# Patient Record
Sex: Female | Born: 1985 | Hispanic: Yes | Marital: Single | Smoking: Never smoker
Health system: Southern US, Community
[De-identification: ages and names within clinical notes are randomized; demographics above are authoritative.]

## PROBLEM LIST (undated history)

## (undated) DIAGNOSIS — I10 Essential (primary) hypertension: Secondary | ICD-10-CM

## (undated) HISTORY — DX: Essential (primary) hypertension: I10

---

## 2006-03-10 ENCOUNTER — Emergency Department: Payer: Self-pay | Admitting: Emergency Medicine

## 2011-02-21 ENCOUNTER — Emergency Department: Payer: Self-pay | Admitting: Emergency Medicine

## 2011-02-24 ENCOUNTER — Emergency Department: Payer: Self-pay | Admitting: Emergency Medicine

## 2014-06-01 ENCOUNTER — Ambulatory Visit: Payer: Self-pay

## 2014-07-01 ENCOUNTER — Ambulatory Visit: Payer: Self-pay | Admitting: Physician Assistant

## 2017-01-14 ENCOUNTER — Other Ambulatory Visit: Payer: Self-pay | Admitting: Family Medicine

## 2017-01-14 DIAGNOSIS — Z3201 Encounter for pregnancy test, result positive: Secondary | ICD-10-CM

## 2017-01-21 ENCOUNTER — Ambulatory Visit: Payer: Self-pay

## 2017-01-28 ENCOUNTER — Ambulatory Visit
Admission: RE | Admit: 2017-01-28 | Discharge: 2017-01-28 | Disposition: A | Discharge status: Home / Self Care | Payer: 59 | Source: Ambulatory Visit | Attending: Family Medicine | Admitting: Family Medicine

## 2017-01-28 DIAGNOSIS — Z3201 Encounter for pregnancy test, result positive: Secondary | ICD-10-CM

## 2017-01-28 DIAGNOSIS — Z3A08 8 weeks gestation of pregnancy: Secondary | ICD-10-CM | POA: Diagnosis not present

## 2017-01-28 DIAGNOSIS — Z3481 Encounter for supervision of other normal pregnancy, first trimester: Secondary | ICD-10-CM | POA: Diagnosis not present

## 2017-03-26 ENCOUNTER — Other Ambulatory Visit: Payer: Self-pay | Admitting: Family Medicine

## 2017-03-26 DIAGNOSIS — Z3480 Encounter for supervision of other normal pregnancy, unspecified trimester: Secondary | ICD-10-CM

## 2017-03-31 ENCOUNTER — Ambulatory Visit
Admission: RE | Admit: 2017-03-31 | Discharge: 2017-03-31 | Disposition: A | Discharge status: Home / Self Care | Payer: 59 | Source: Ambulatory Visit | Attending: Family Medicine | Admitting: Family Medicine

## 2017-03-31 DIAGNOSIS — O99810 Abnormal glucose complicating pregnancy: Secondary | ICD-10-CM | POA: Diagnosis present

## 2017-03-31 DIAGNOSIS — Z3A17 17 weeks gestation of pregnancy: Secondary | ICD-10-CM | POA: Diagnosis not present

## 2017-03-31 DIAGNOSIS — Z3480 Encounter for supervision of other normal pregnancy, unspecified trimester: Secondary | ICD-10-CM

## 2017-04-22 ENCOUNTER — Other Ambulatory Visit: Payer: Self-pay | Admitting: Family Medicine

## 2017-04-22 DIAGNOSIS — Z3482 Encounter for supervision of other normal pregnancy, second trimester: Secondary | ICD-10-CM

## 2017-05-11 ENCOUNTER — Encounter (HOSPITAL_COMMUNITY): Payer: Self-pay

## 2017-05-11 ENCOUNTER — Ambulatory Visit
Admission: RE | Admit: 2017-05-11 | Discharge: 2017-05-11 | Disposition: A | Discharge status: Home / Self Care | Payer: 59 | Source: Ambulatory Visit | Attending: Family Medicine | Admitting: Family Medicine

## 2017-05-11 DIAGNOSIS — Z3482 Encounter for supervision of other normal pregnancy, second trimester: Secondary | ICD-10-CM

## 2017-05-11 DIAGNOSIS — Z362 Encounter for other antenatal screening follow-up: Secondary | ICD-10-CM | POA: Diagnosis present

## 2017-05-11 DIAGNOSIS — Z3A23 23 weeks gestation of pregnancy: Secondary | ICD-10-CM | POA: Diagnosis not present

## 2017-07-08 ENCOUNTER — Other Ambulatory Visit: Payer: Self-pay | Admitting: Family Medicine

## 2017-07-08 DIAGNOSIS — Z3483 Encounter for supervision of other normal pregnancy, third trimester: Secondary | ICD-10-CM

## 2017-07-16 ENCOUNTER — Ambulatory Visit
Admission: RE | Admit: 2017-07-16 | Discharge: 2017-07-16 | Disposition: A | Discharge status: Home / Self Care | Payer: 59 | Source: Ambulatory Visit | Attending: Family Medicine | Admitting: Family Medicine

## 2017-07-16 DIAGNOSIS — Z3483 Encounter for supervision of other normal pregnancy, third trimester: Secondary | ICD-10-CM

## 2018-11-02 IMAGING — US US OB COMP LESS 14 WK
1 series · 14 of 28 positions shown · non-contrast
Comparison: None.

CLINICAL DATA: Positive pregnancy test.

EXAM:
OBSTETRIC <14 WK US AND TRANSVAGINAL OB US
TECHNIQUE: Both transabdominal and transvaginal ultrasound examinations were
performed for complete evaluation of the gestation as well as the
maternal uterus, adnexal regions, and pelvic cul-de-sac.
Transvaginal technique was performed to assess early pregnancy.

[Series 1: us ob comp less 14 wk · 0.20mm/px · 14 of 329 slices shown]
[im 13/329]
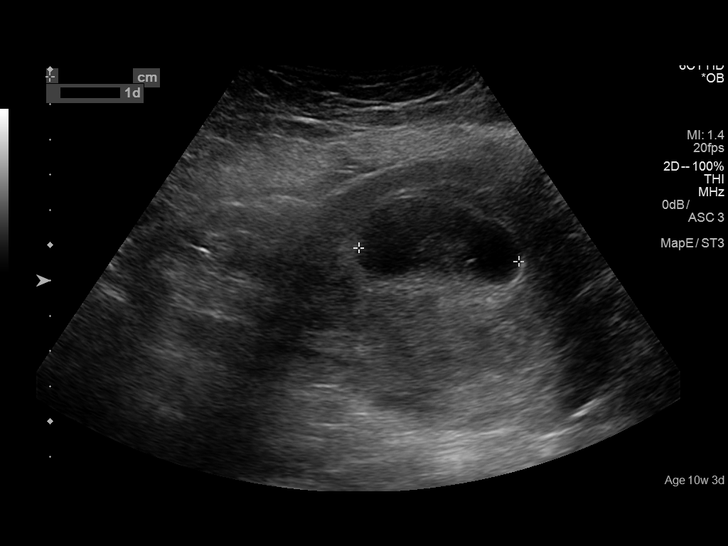
[im 37/329]
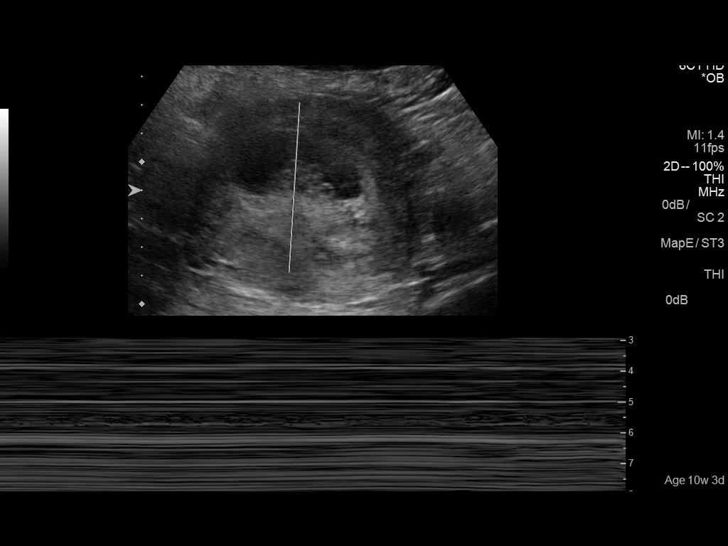
[im 61/329]
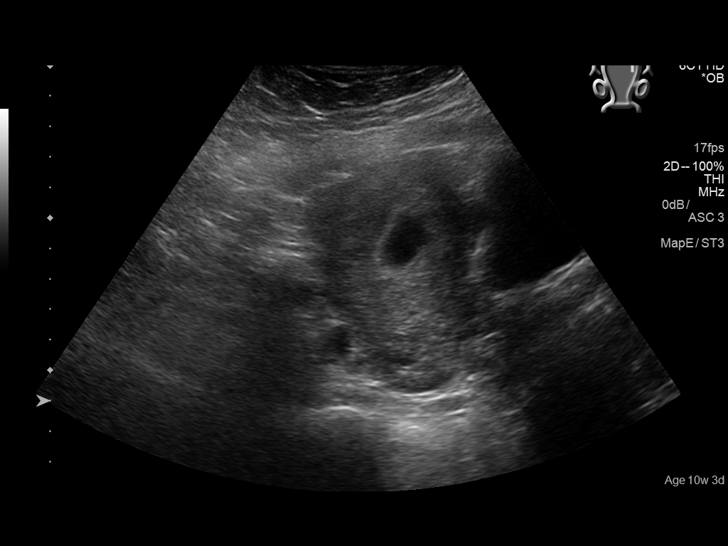
[im 86/329]
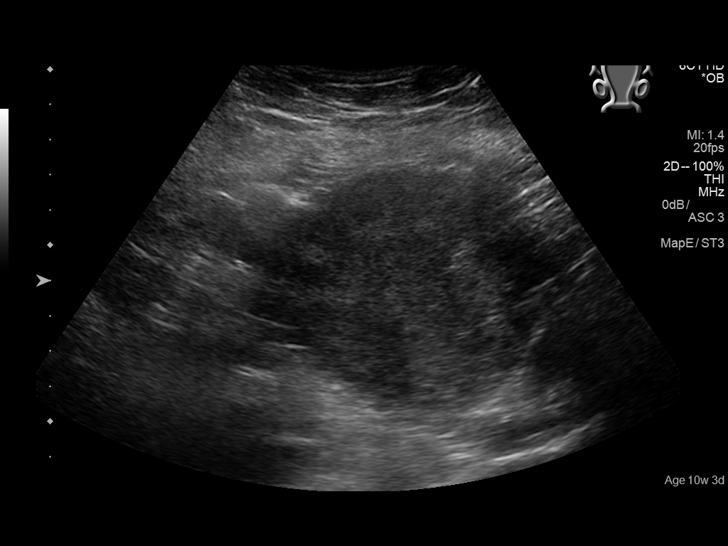
[im 110/329]
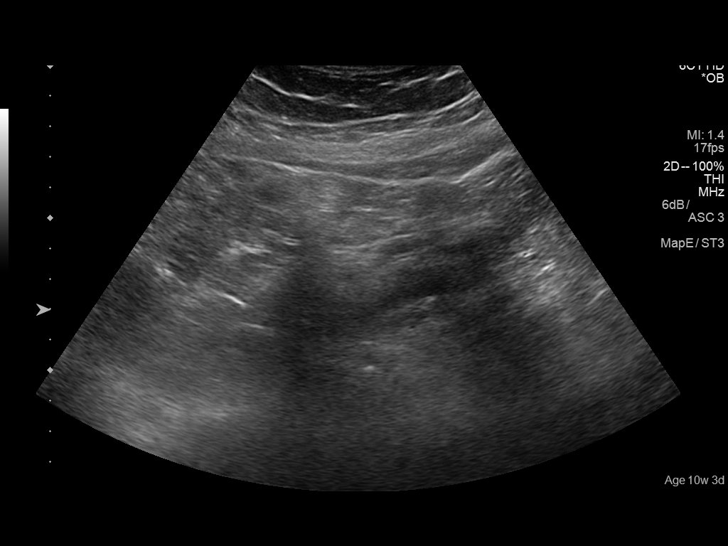
[im 134/329]
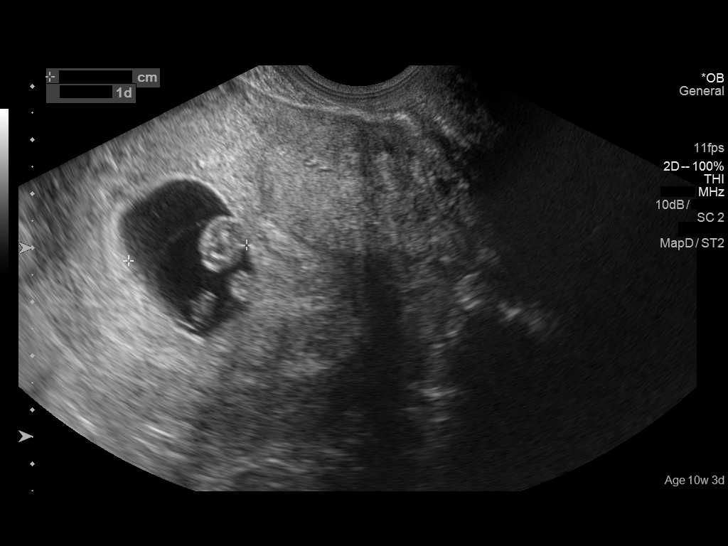
[im 158/329]
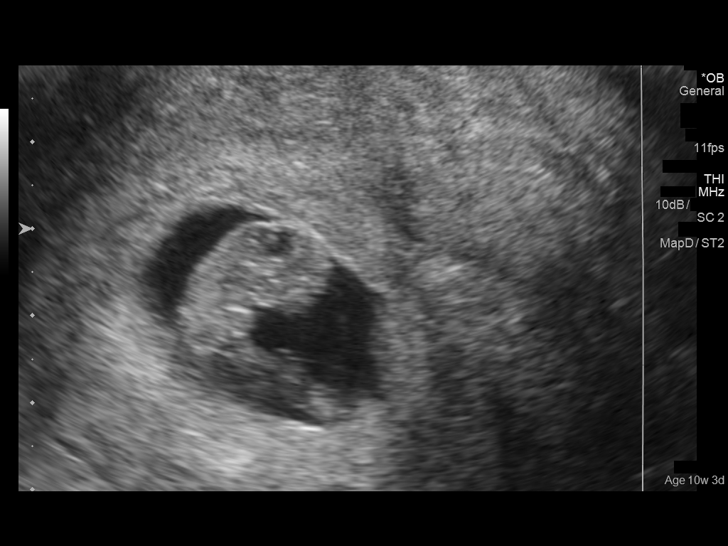
[im 183/329]
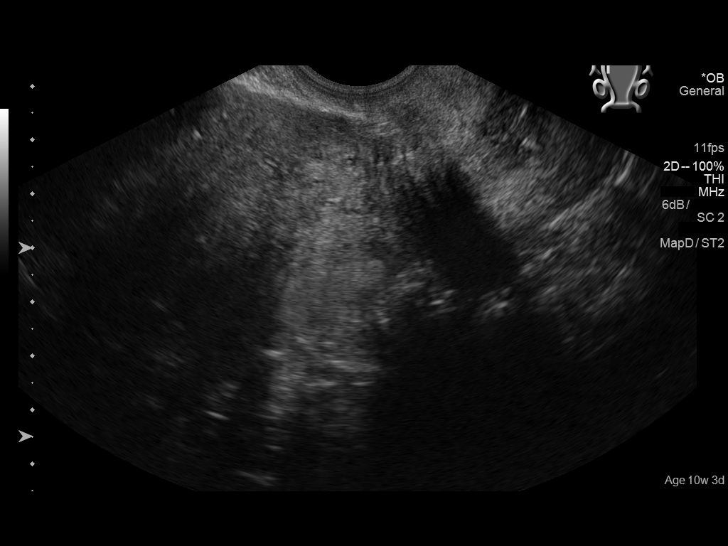
[im 207/329]
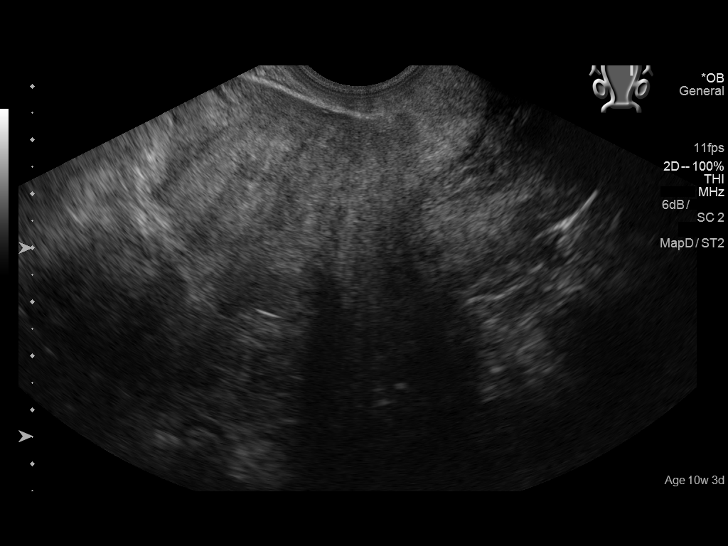
[im 231/329]
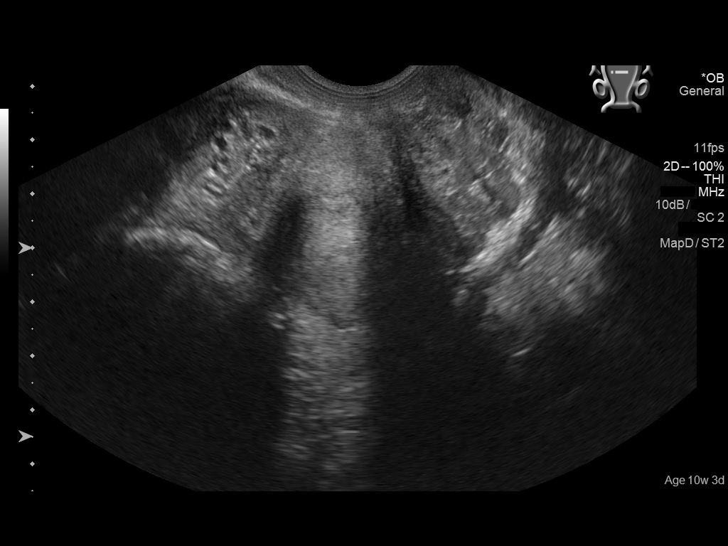
[im 256/329]
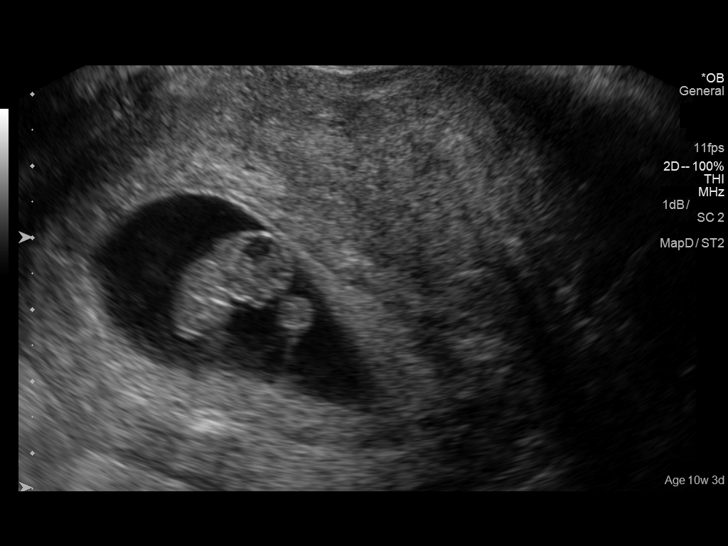
[im 280/329]
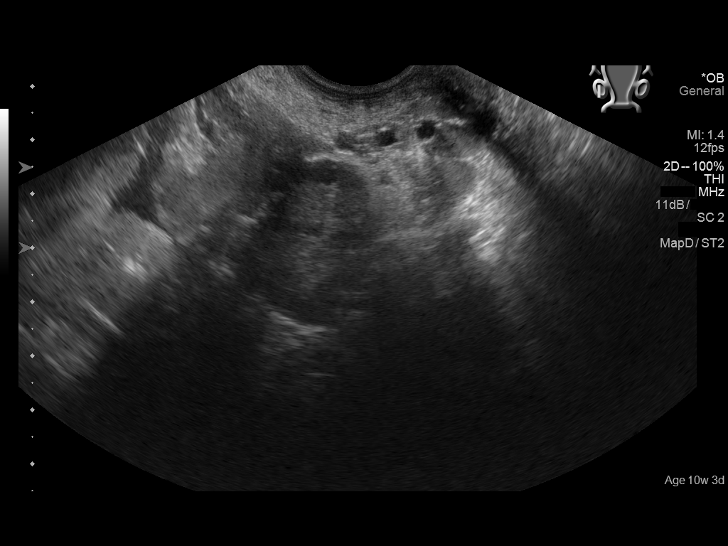
[im 304/329]
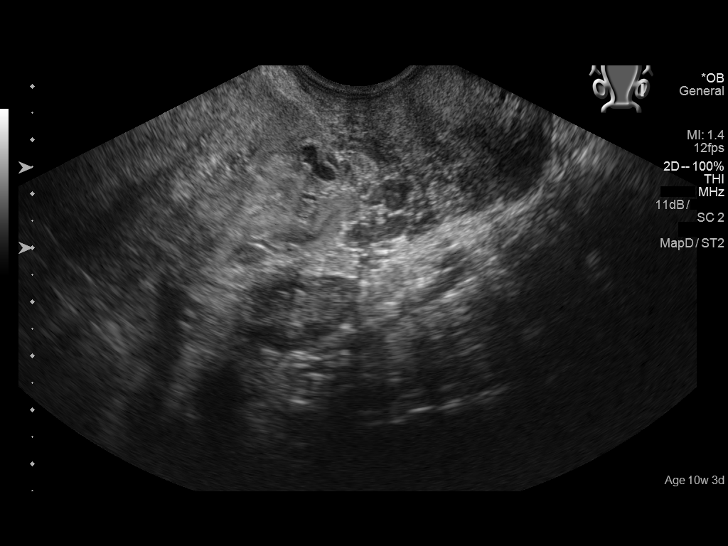
[im 329/329]
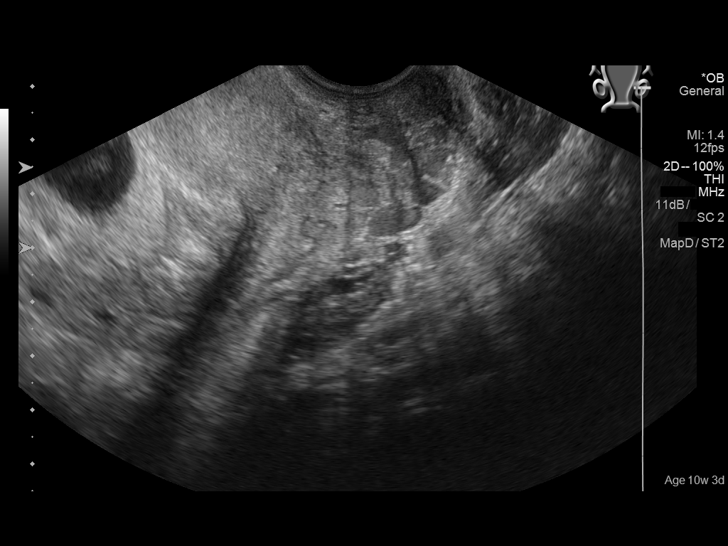

[14 of 28 positions shown; findings below may reference images not displayed]

FINDINGS: Intrauterine gestational sac: Single.

Yolk sac:  Visualized.

Embryo:  Visualized.

Cardiac Activity: Visualized.

Heart Rate: 165  bpm

CRL:  21  mm   8 w   5 d                  US EDC: September 04, 2017.

Subchorionic hemorrhage:  None visualized.

Maternal uterus/adnexae: Ovaries appear normal. No free fluid is
noted.
IMPRESSION: Single live intrauterine gestation of 8 weeks 5 days.

## 2019-01-03 IMAGING — US US OB COMP +14 WK
1 series · 13 of 28 positions shown · non-contrast
Comparison: none

CLINICAL DATA: Abnormal maternal glucose tolerance. Multi gravida.
Current assigned gestational age of 17 weeks 0 days.

EXAM:
OBSTETRICAL ULTRASOUND >14 WKS

[Series 1: us ob comp +14 wk · 0.26mm/px · 13 of 95 slices shown]
[im 4/95]
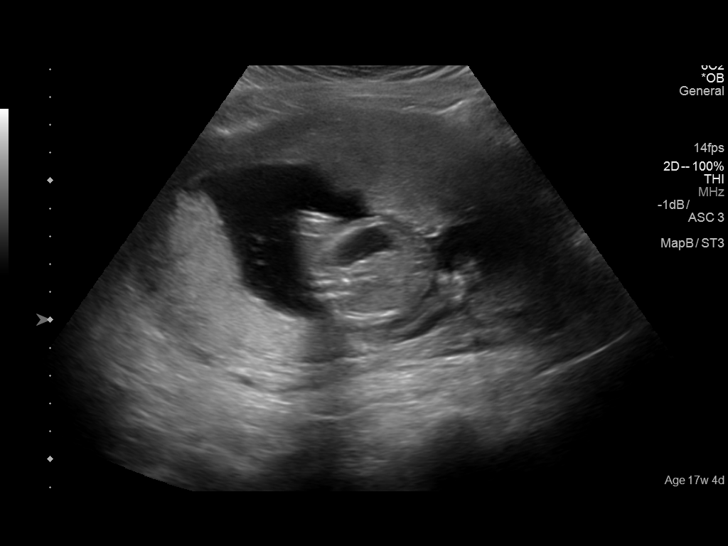
[im 11/95]
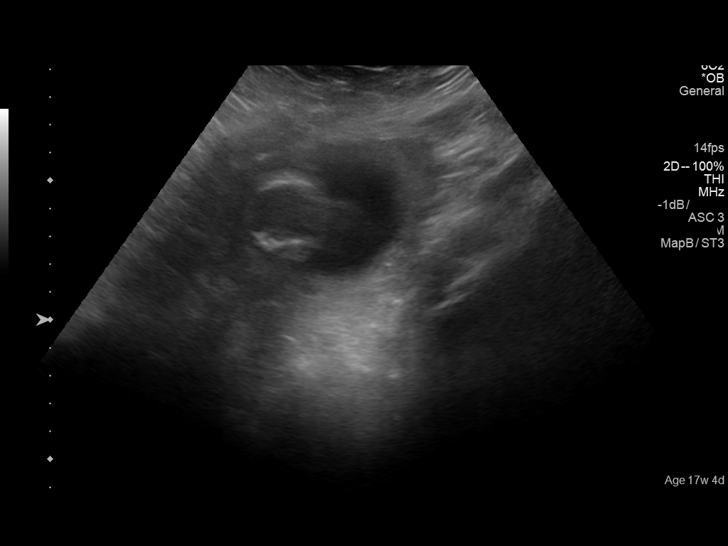
[im 18/95]
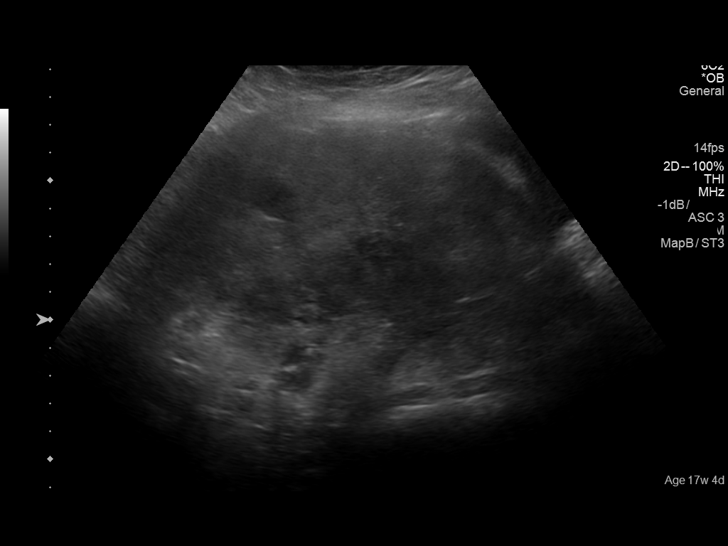
[im 25/95]
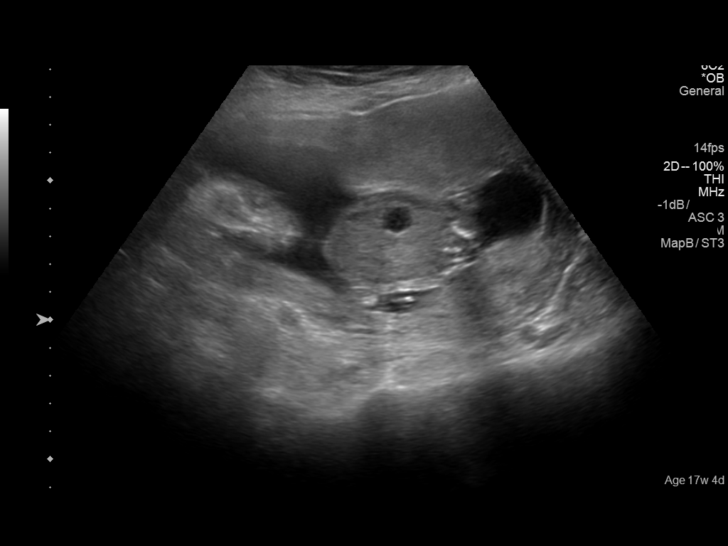
[im 32/95]
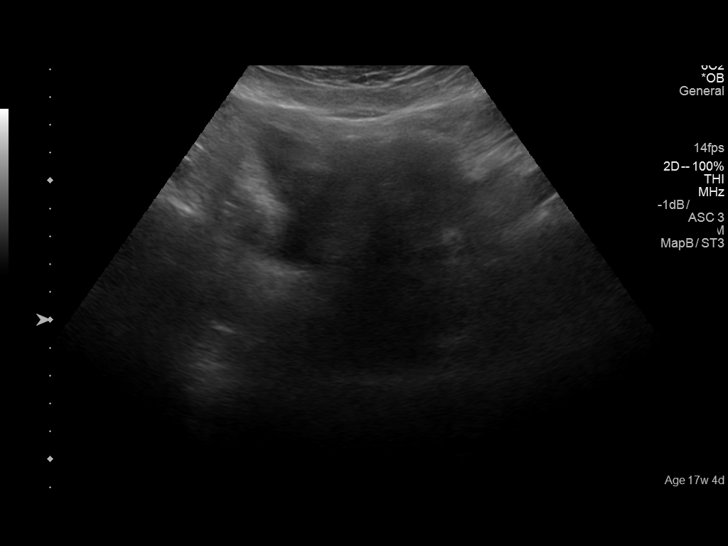
[im 39/95]
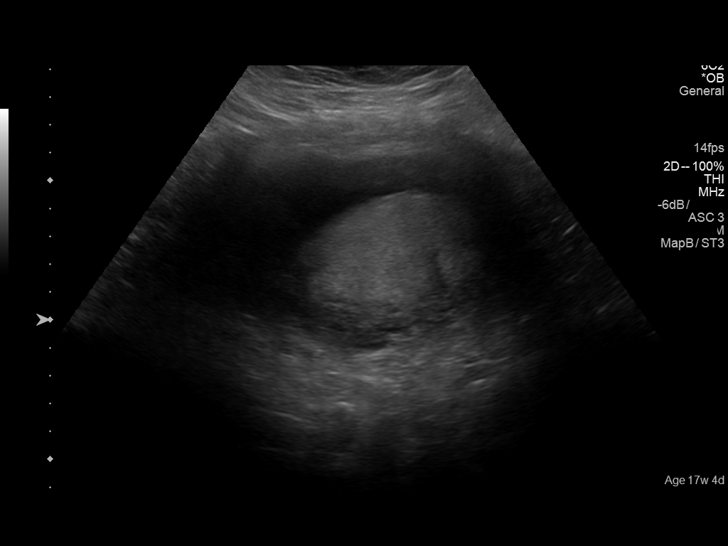
[im 49/95]
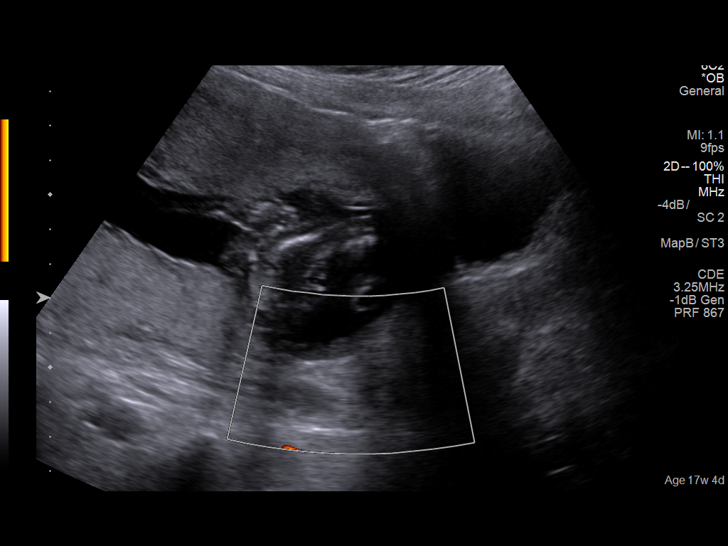
[im 56/95]
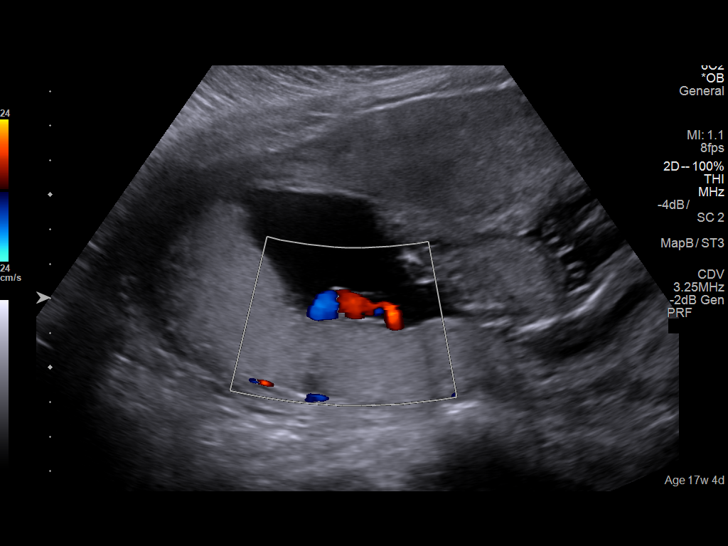
[im 63/95]
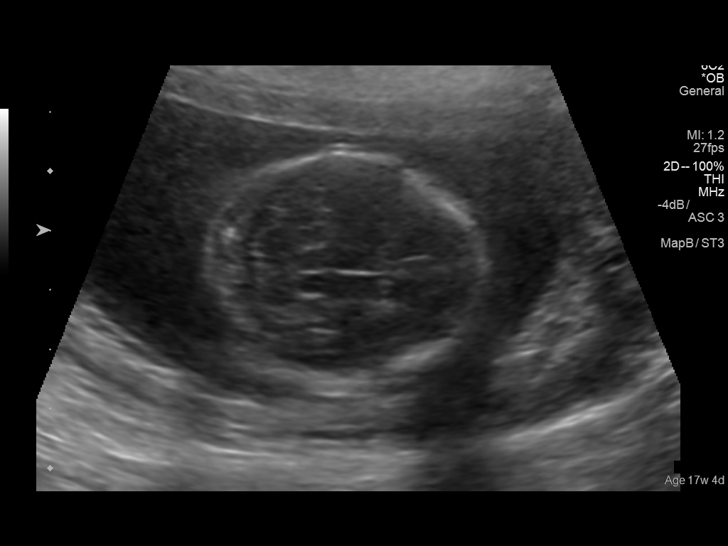
[im 70/95]
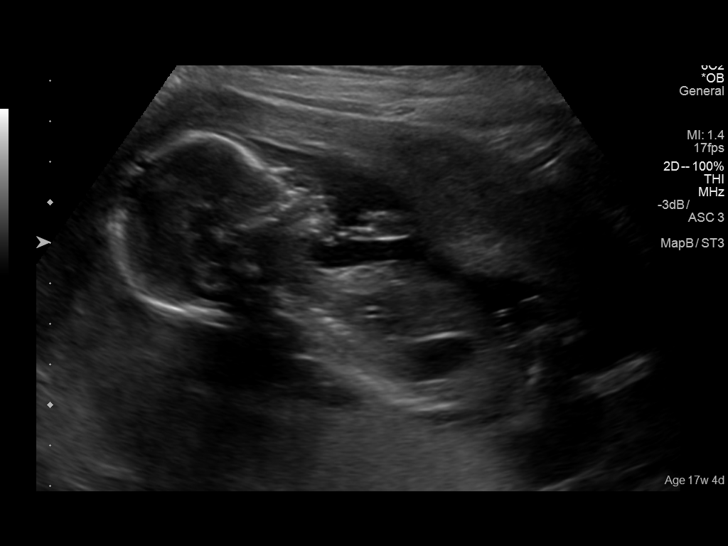
[im 77/95]
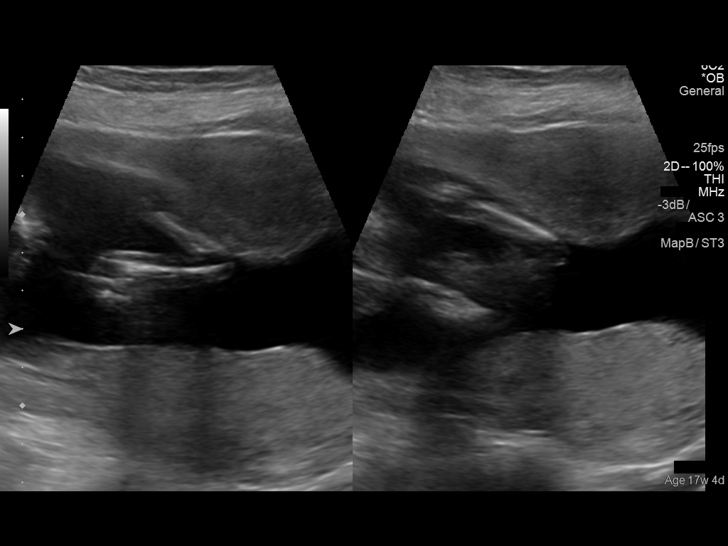
[im 84/95]
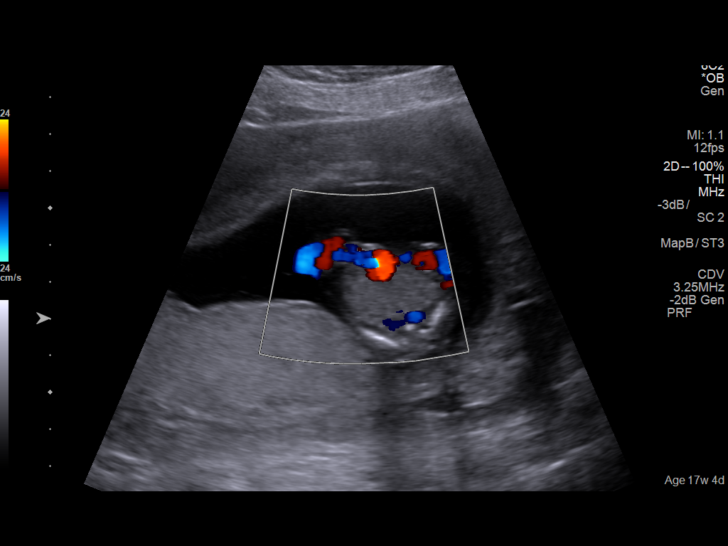
[im 91/95]
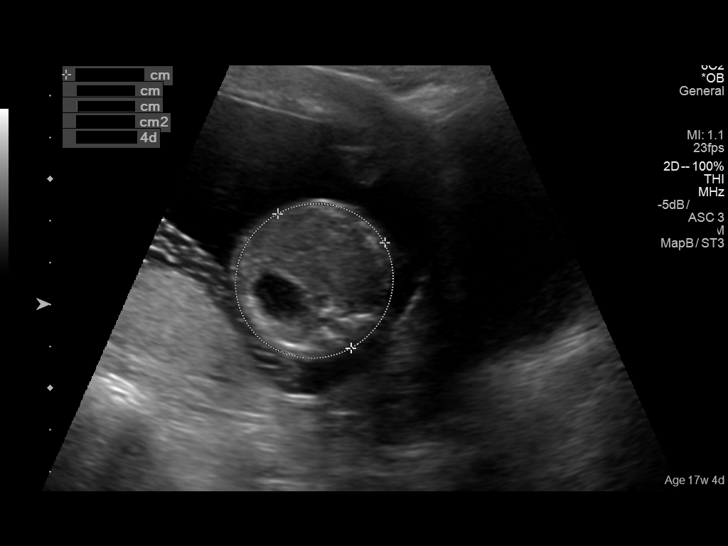

[13 of 28 positions shown; findings below may reference images not displayed]

FINDINGS: Number of Fetuses: 1

Heart Rate:  153 bpm

Movement: Yes

Presentation: Breech

Previa: No

Placental Location: Posterior

Amniotic Fluid (Subjective): Within normal limits

Amniotic Fluid (Objective):

Vertical pocket 3.1cm

FETAL BIOMETRY

BPD:  3.7cm 17w 2d

HC:    13.6cm  17w   0d

AC:   12.0cm  17w   5d

FL:   2.4cm  17w   2d

Current Mean GA: 17w 2d              US EDC: 09/06/2017

FETAL ANATOMY

Lateral Ventricles: Limited views appear normal

Thalami/CSP: Appears normal

Posterior Fossa:  Appears normal

Nuchal Region: Appears normal    NFT= 2.6mm

Upper Lip: Not visualized

Spine: Not visualized

4 Chamber Heart on Left: Not visualized

LVOT: Appears normal

RVOT: Appears normal

Stomach on Left: Appears normal

3 Vessel Cord: Appears normal

Cord Insertion site: Appears normal

Kidneys: Not well visualized

Bladder: Appears normal

Extremities: Appears normal

Sex: Male

Technically difficult due to: Fetal position and early gestational
age.

Maternal Findings:

Cervix:  4.3 cm TA
IMPRESSION: Assigned gestational age is currently 17 weeks 0 days. Appropriate
fetal growth.

Suboptimal evaluation of fetal anatomy, however no fetal
abnormalities involving visualized anatomy listed above.

## 2019-02-13 IMAGING — US US OB FOLLOW-UP
1 series · 13 of 28 positions shown · non-contrast
Comparison: none

CLINICAL DATA: 30-year-old gravida 3 para 2. Follow-up for anatomy
and growth. By LMP patient is 23 weeks 1 day and has EDC of
09/06/2017.

EXAM:
OBSTETRIC 14+ WK ULTRASOUND FOLLOW-UP

[Series 1: us ob follow-up · 0.26mm/px · 13 of 76 slices shown]
[im 3/76]
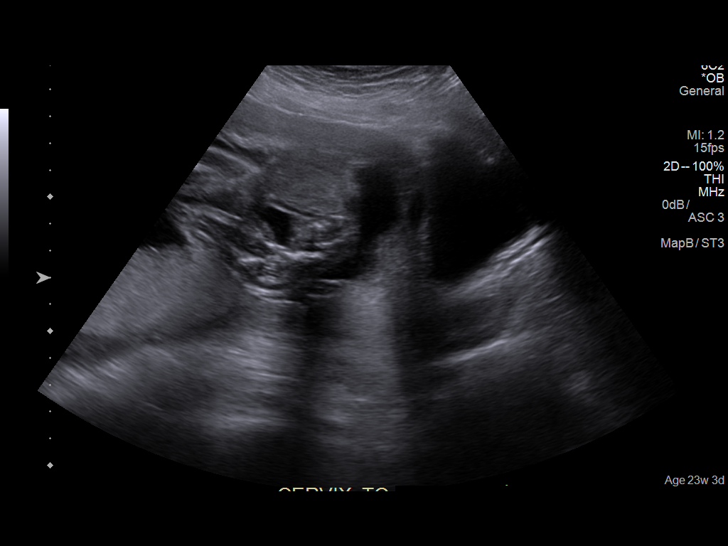
[im 9/76]
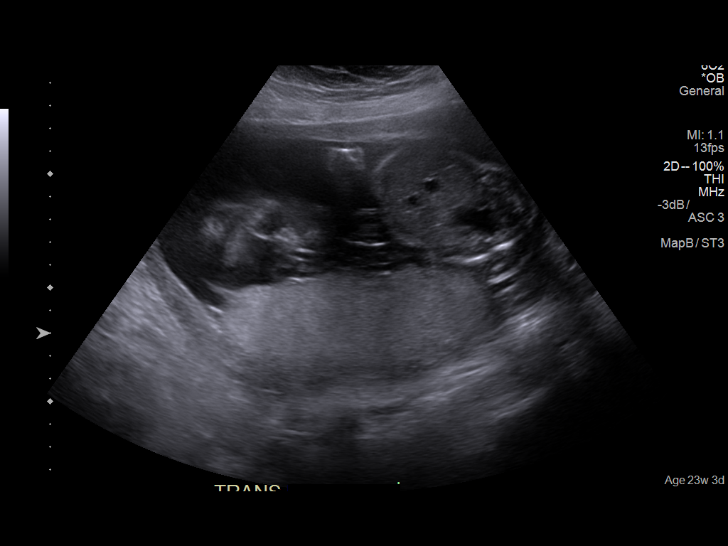
[im 14/76]
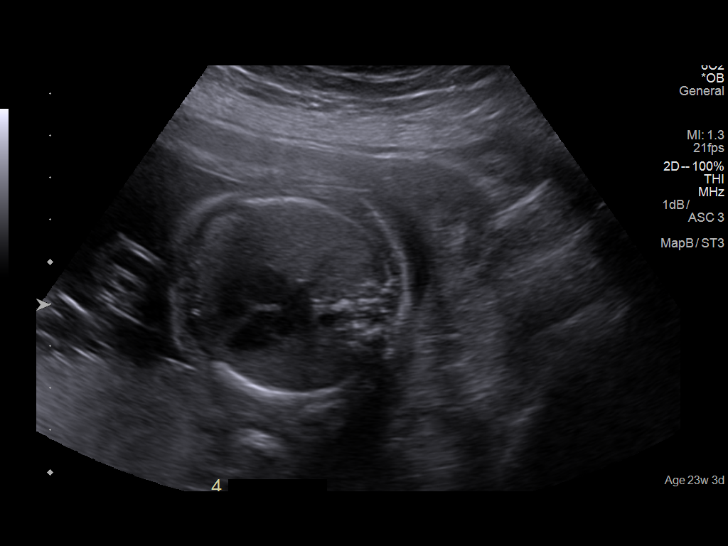
[im 20/76]
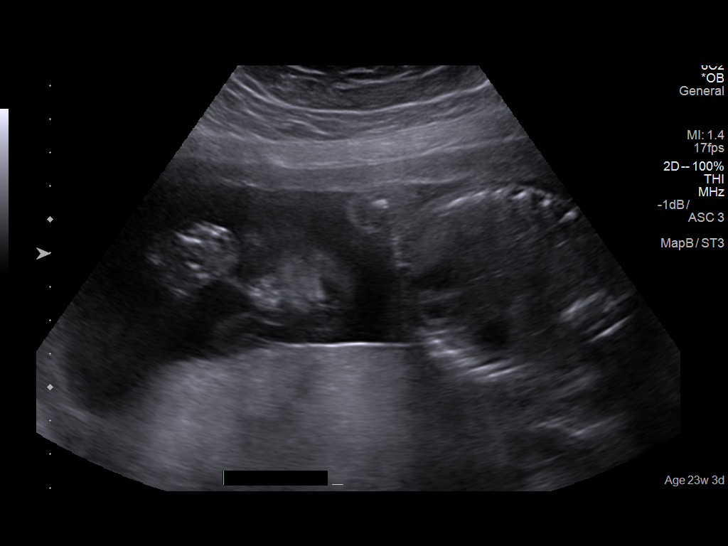
[im 26/76]
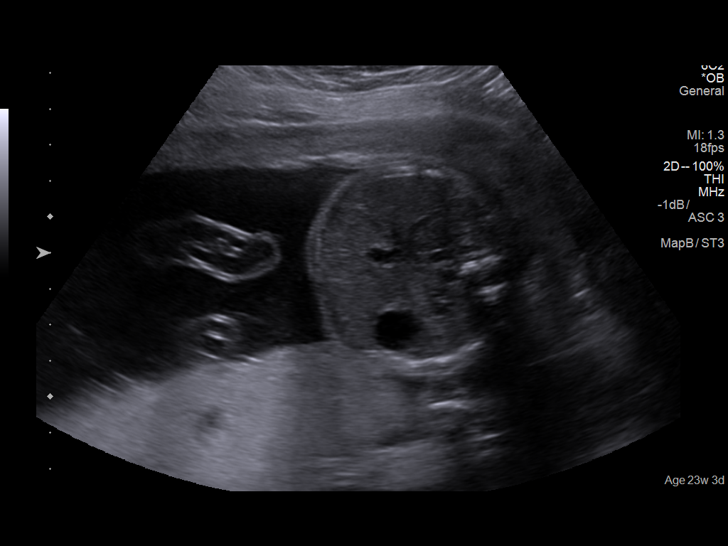
[im 31/76]
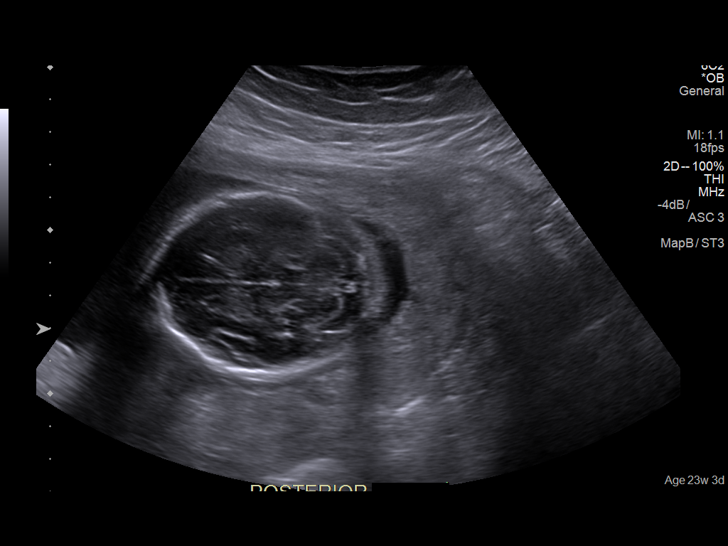
[im 39/76]
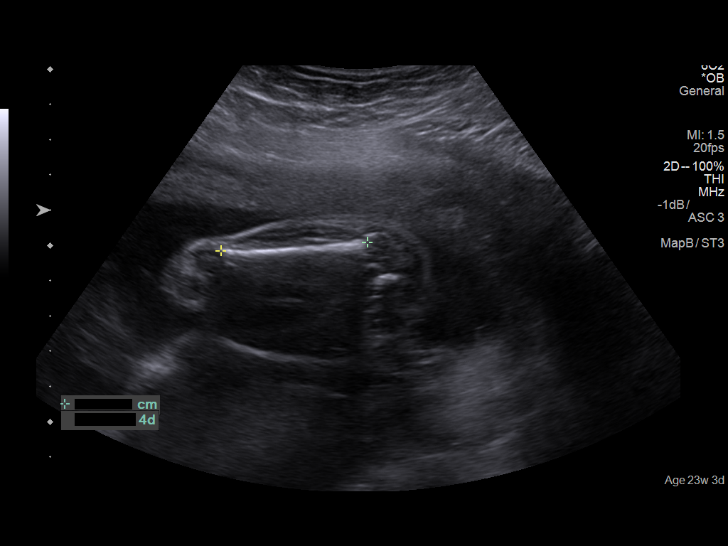
[im 45/76]
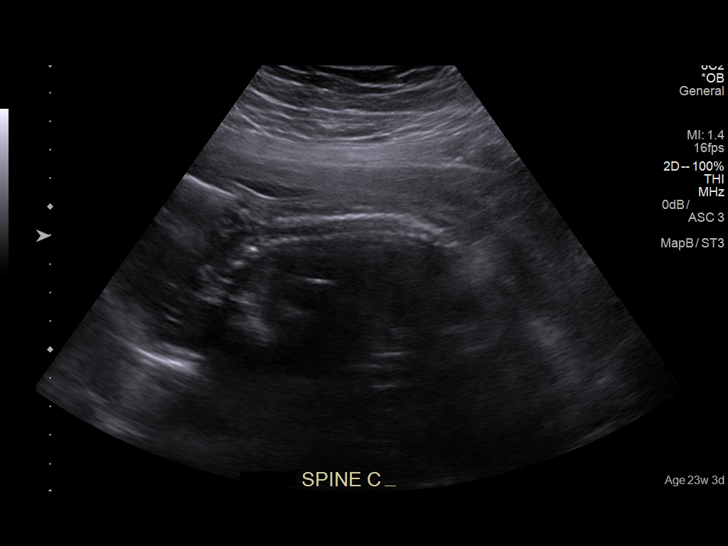
[im 51/76]
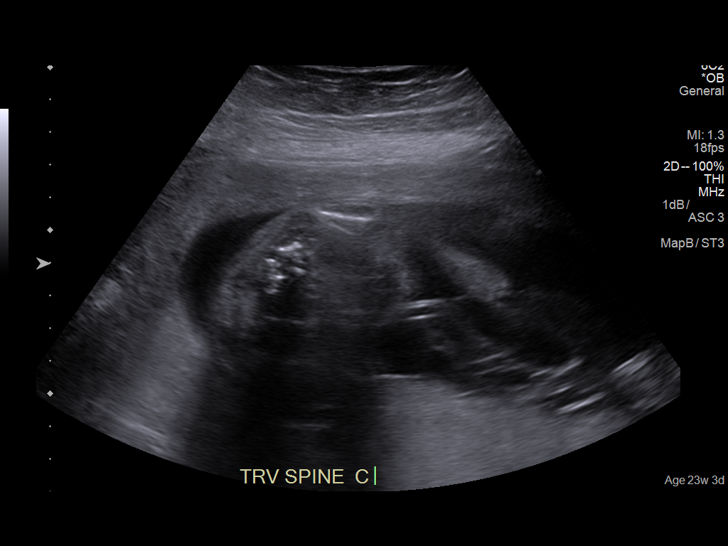
[im 56/76]
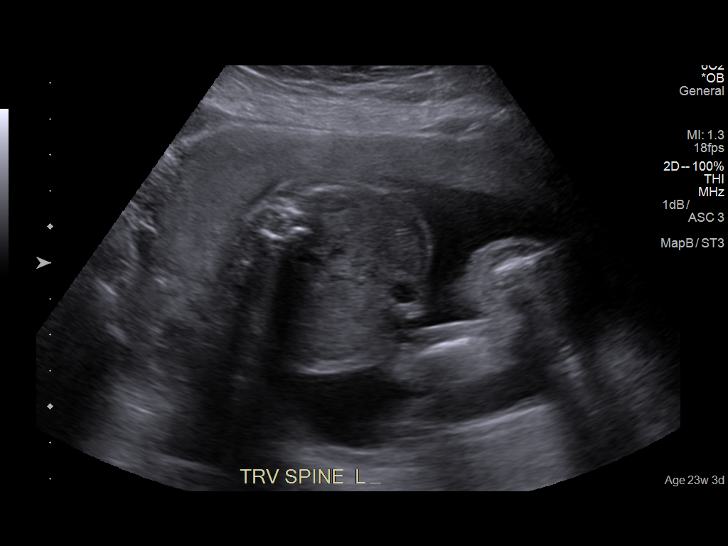
[im 62/76]
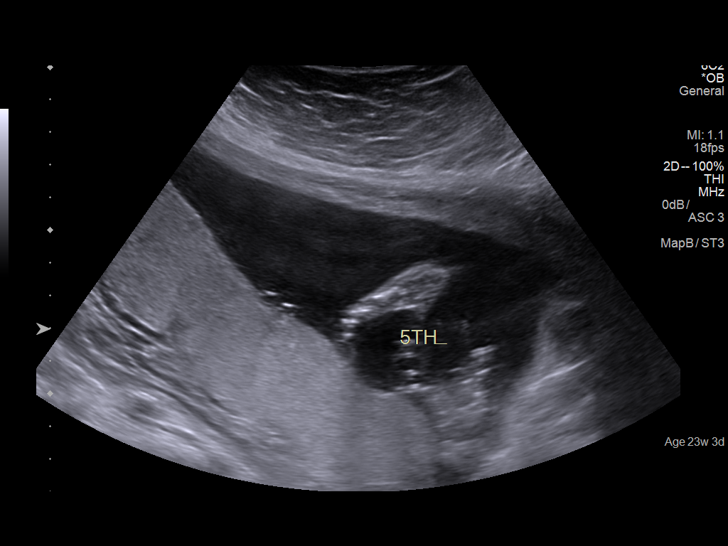
[im 67/76]
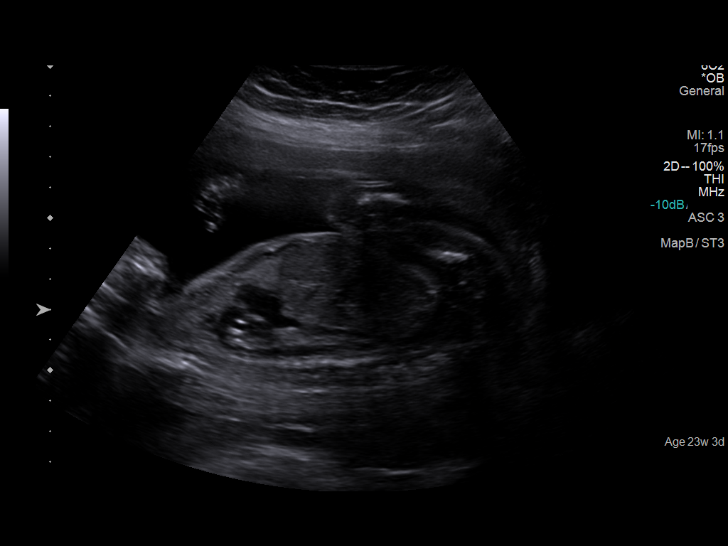
[im 73/76]
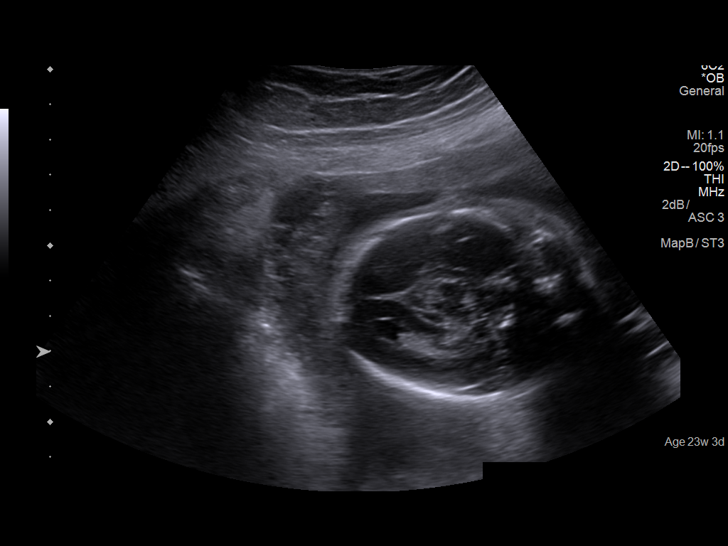

[13 of 28 positions shown; findings below may reference images not displayed]

FINDINGS: Number of Fetuses: 1

Heart Rate:  155 bpm

Movement: Present

Presentation: Variable

Previa: None

Placental Location: Posterior

Amniotic Fluid (Subjective): Normal

Amniotic Fluid (Objective):

Vertical pocket 4.4cm

FETAL BIOMETRY

BPD:  5.56cm 23w   0d

HC:    20.38cm  22w   4d

AC:   18.63cm  23w   3d

FL:   4.19cm  23w   4d

Current Mean GA: 23w 2d          US EDC: 09/05/2017

Assigned GA:  23w 1d              Assigned EDC:  09/06/2017

FETAL ANATOMY

Lateral Ventricles: Previously visualized

Thalami/CSP: Previously visualized

Posterior Fossa:  Previously visualized

Nuchal Region: Previously visualized    NFT= not applicablemm

Upper Lip: Visualized

Spine: Visualized

4 Chamber Heart on Left: Visualized

LVOT: Previously visualized

RVOT: Previously visualized

Stomach on Left: Visualized

3 Vessel Cord: Visualized

Cord Insertion site: Visualized

Kidneys: Visualized

Bladder: Visualized

Extremities: Previously visualized

Technically difficult due to: Not applicable

MATERNAL FINDINGS:

Cervix: 3.5 cm on transabdominal evaluation.
IMPRESSION: 1. Single living intrauterine fetus in variable presentation.
2. Ultrasound size correlates well with LMP dating.
3. No fetal anomalies are identified.

## 2019-04-20 IMAGING — US US OB FOLLOW-UP
1 series · 13 of 28 positions shown · non-contrast
Comparison: none

CLINICAL DATA: Follow-up for appropriate growth, SS amniotic fluid
volume.

EXAM:
OBSTETRICAL ULTRASOUND >14 WKS

[Series 1: us ob follow-up · 0.26mm/px · 13 of 41 slices shown]
[im 2/41]
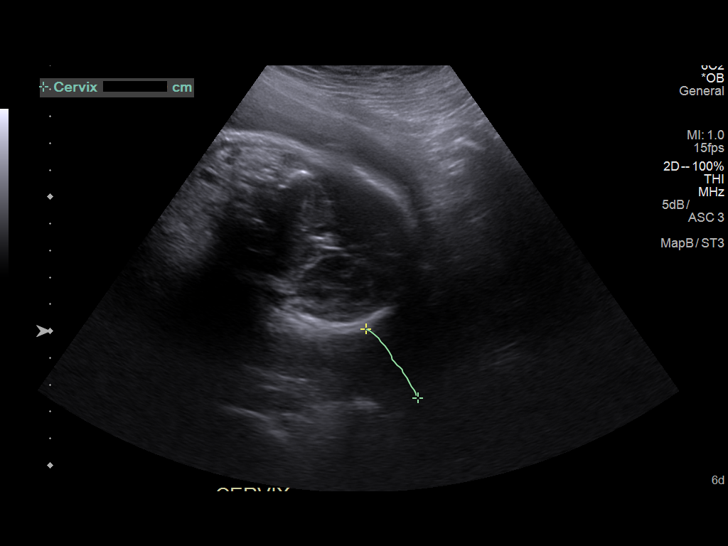
[im 5/41]
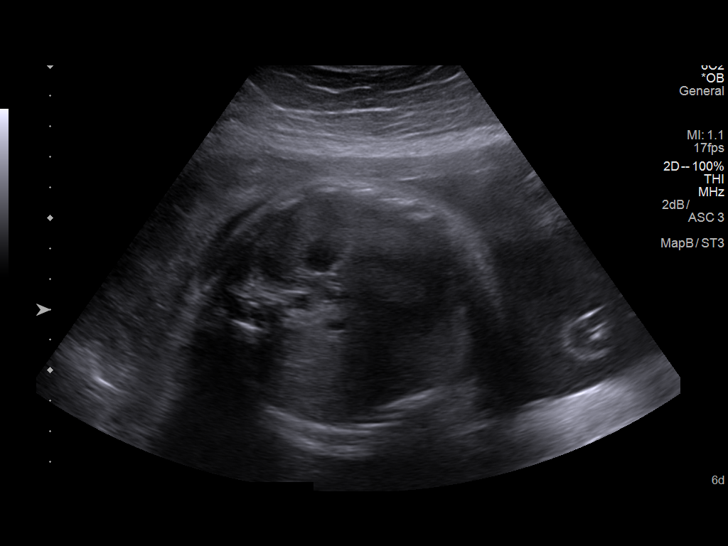
[im 8/41]
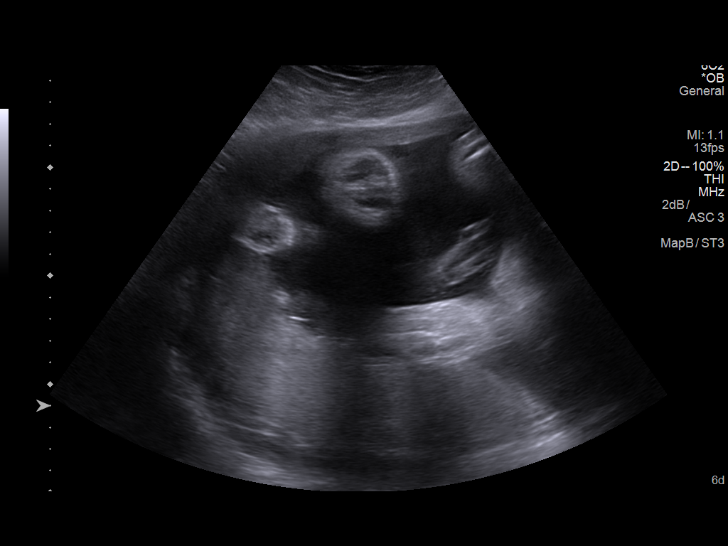
[im 11/41]
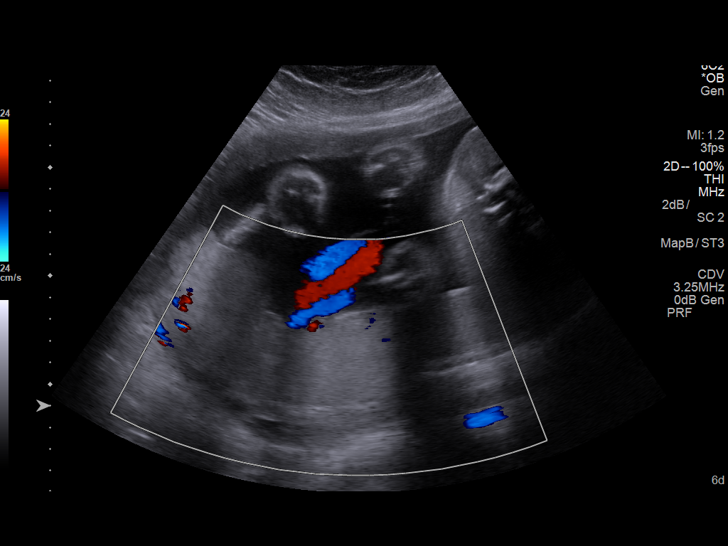
[im 14/41]
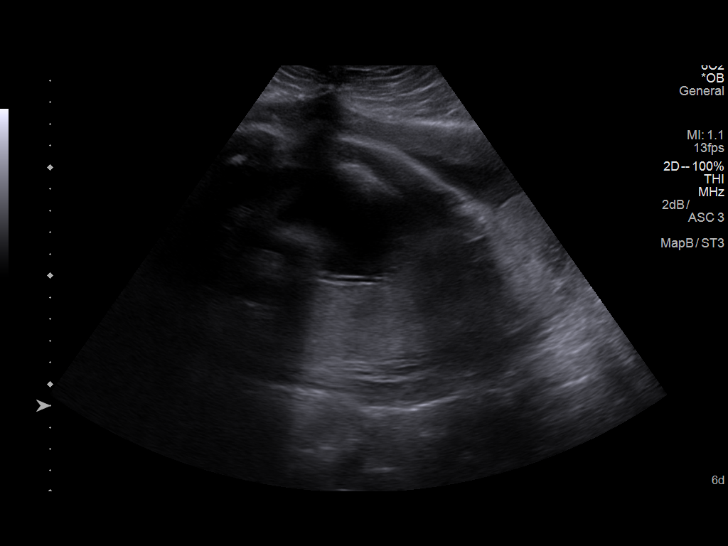
[im 17/41]
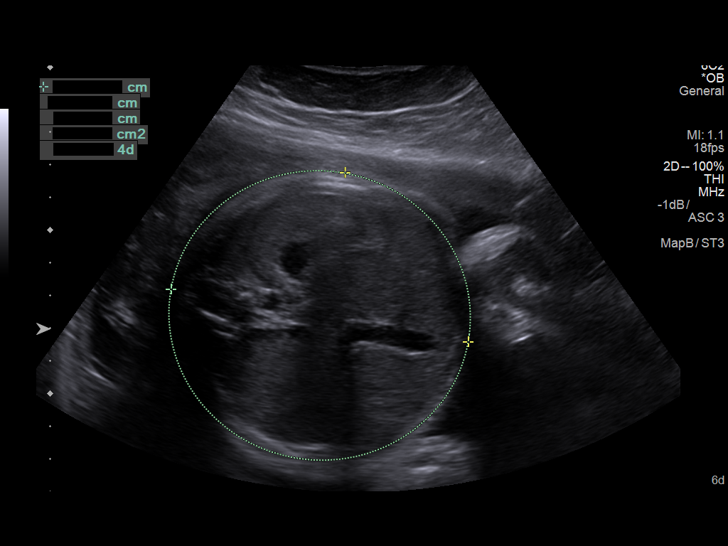
[im 21/41]
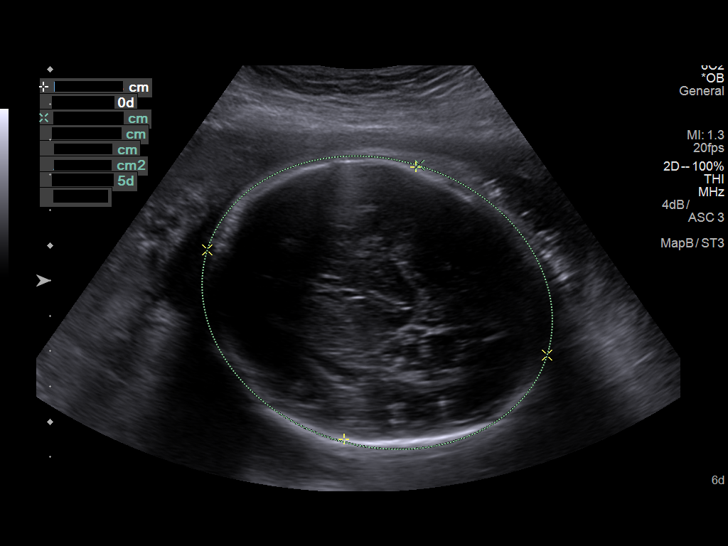
[im 24/41]
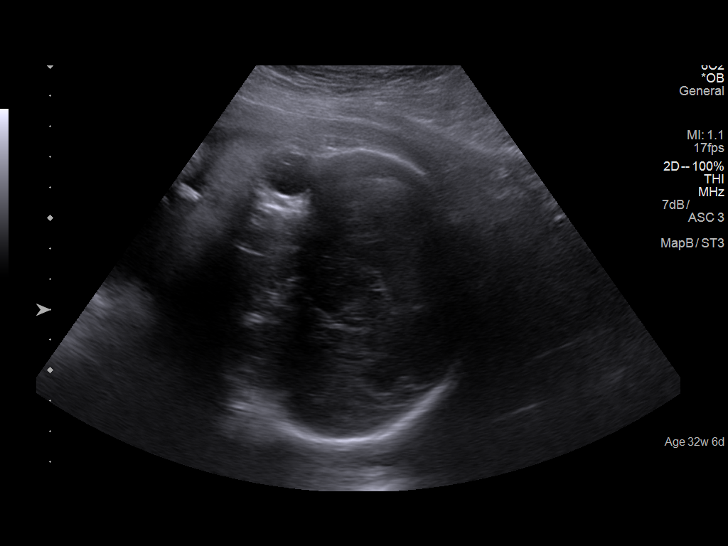
[im 27/41]
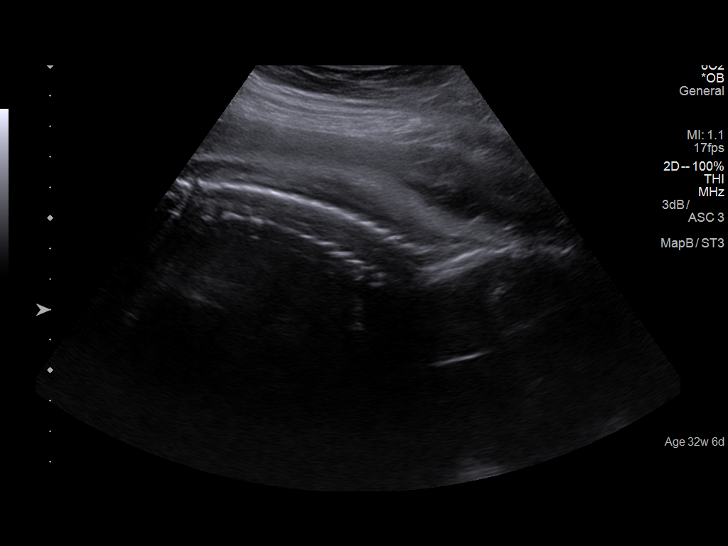
[im 30/41]
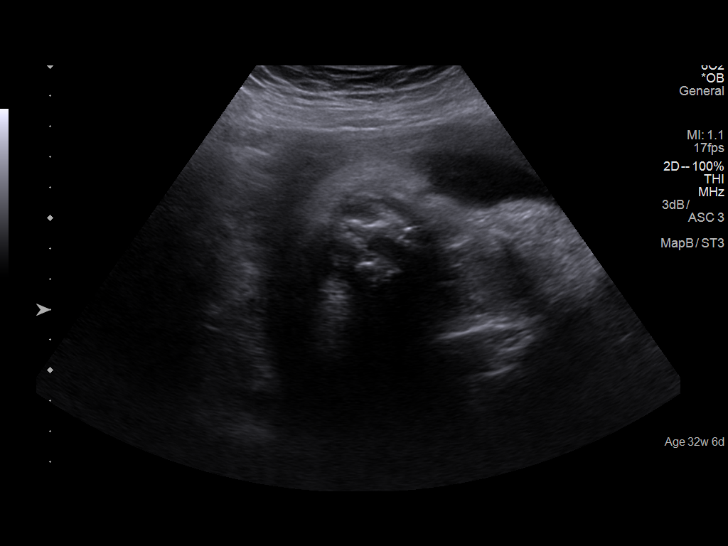
[im 33/41]
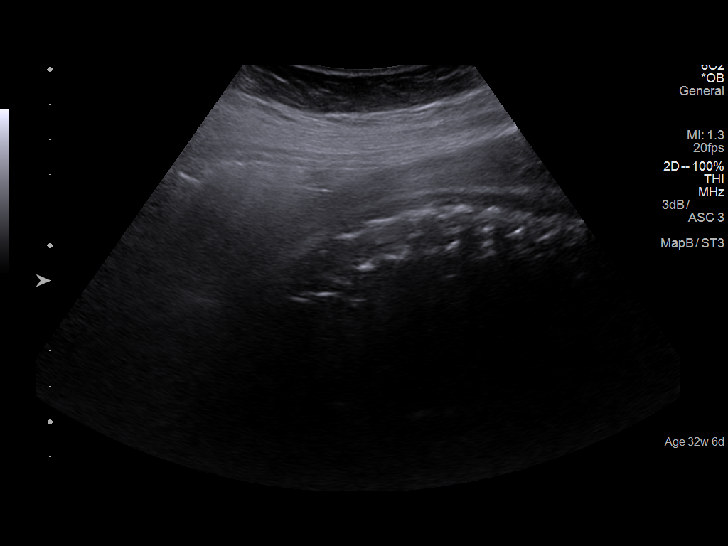
[im 36/41]
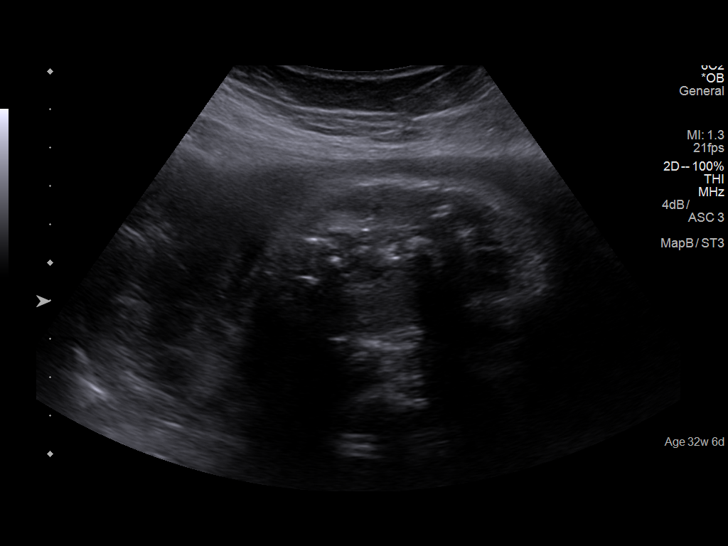
[im 39/41]
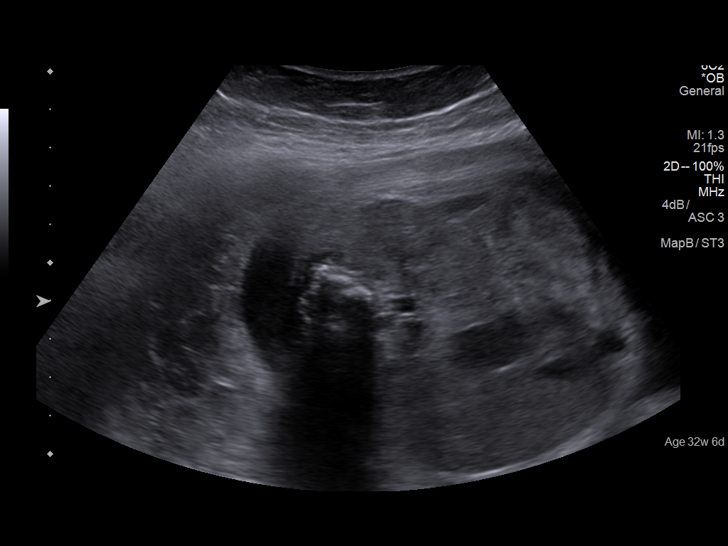

[13 of 28 positions shown; findings below may reference images not displayed]

FINDINGS: Number of Fetuses: 1

Heart Rate:  139 bpm

Movement: Present

Presentation: Cephalic

Previa: None. The distance from the lower placental segment to the
internal cervical os is 5.4 cm

Placental Location: Posterior

Amniotic Fluid (Subjective): Normal

Amniotic Fluid (Objective):  Normal

AFI 11.9 cm (5%ile= 8.6 cm, 95%= 24.2 cm for 32 wks)

FETAL BIOMETRY

BPD:  8.04cm 32w 2d

HC:    29.1cm  32w   0d

AC:   28.5cm  32w   4d

FL:   6.05cm  31w   3d

Current Mean GA: 31w 5d              US EDC: September 12, 2017

Estimated Fetal Weight:  1911g    21%ile

FETAL ANATOMY

Lateral Ventricles: Visualized

Thalami/CSP: Previously visualized

Posterior Fossa:  Previously visualized

Nuchal Region: Previously visualized

Upper Lip: Previously visualized

Spine: Visualized

4 Chamber Heart on Left: Visualized

LVOT: Previously visualized

RVOT: Previously visualized

Stomach on Left: Visualize

3 Vessel Cord: Previously visualized

Cord Insertion site: Previously visualized

Kidneys: Visualized

Bladder: Visualized

Extremities: Previously visualized

Technically difficult due to: Visualization of the spine was limited
due to fetal position and the maternal body habitus.

Maternal Findings:

Cervix:  The cervical length is 3.2 cm and the cervix appears closed
IMPRESSION: Single viable IUP with cephalic presentation and posterior placenta.
The estimated gestational age is 31 weeks 5 days corresponding to an
estimated date of confinement September 12, 2017. This is
approximately 8 days less mature than that predicted by previous
ultrasound and clinical dating. No evidence of placenta previa. The
amniotic fluid volume is normal. Estimated fetal weight of 9699 g is
at [REDACTED] percentile.

No fetal anomalies are observed.

## 2020-07-09 ENCOUNTER — Other Ambulatory Visit: Payer: Self-pay

## 2020-07-09 ENCOUNTER — Ambulatory Visit
Admission: EM | Admit: 2020-07-09 | Discharge: 2020-07-09 | Disposition: A | Discharge status: Home / Self Care | Payer: No Typology Code available for payment source | Attending: Physician Assistant | Admitting: Physician Assistant

## 2020-07-09 DIAGNOSIS — M545 Low back pain: Secondary | ICD-10-CM

## 2020-07-09 DIAGNOSIS — G8929 Other chronic pain: Secondary | ICD-10-CM

## 2020-07-09 DIAGNOSIS — M542 Cervicalgia: Secondary | ICD-10-CM

## 2020-07-09 MED ORDER — NAPROXEN 375 MG PO TABS: 375.0000 mg | ORAL_TABLET | Freq: Two times a day (BID) | ORAL | 0 refills | Status: AC

## 2020-07-09 MED ORDER — LIDOCAINE 4 % EX PTCH: 1.0000 | MEDICATED_PATCH | Freq: Two times a day (BID) | CUTANEOUS | 0 refills | Status: AC

## 2020-07-09 MED ORDER — TIZANIDINE HCL 4 MG PO TABS: 4.0000 mg | ORAL_TABLET | Freq: Every day | ORAL | 0 refills | Status: AC

## 2020-07-09 NOTE — Discharge Instructions (Signed)
Take naproxen as needed, take this with food  You Zanaflex at night, this will make you sleepy do not drive, drink alcohol or operate machinery within 8 hours of taking Use lidocaine patches twice a day  Follow-up with your primary care in 5 to 7 days

## 2020-07-09 NOTE — ED Provider Notes (Signed)
[facility]    CSN: [id number] Arrival date & time: 07/09/20  1744      History   Chief Complaint Chief Complaint  Patient presents with  . Neck Pain    Left   . Back Pain    Lumbar    HPI Kristin Chandler is a 34 y.o. female.   Patient reports for left-sided neck pain and low back pain. She reports symptoms been present for about 4 months. She reports most recent flare in the last couple of days. She reports last night now caused her to lose some sleep and this prompted her to come in. She denies injury to the neck or low back. She reports she has to bend over at work a lot she wonders if this causes the symptoms. She denies any numbness, tingling in the upper or lower extremities. No difficulty walking or moving neck.     History reviewed. No pertinent past medical history.  There are no problems to display for this patient.   History reviewed. No pertinent surgical history.  OB History    Gravida  1   Para      Term      Preterm      AB      Living        SAB      TAB      Ectopic      Multiple      Live Births               Home Medications    Prior to Admission medications   Medication Sig Start Date End Date Taking? Authorizing Provider  Lidocaine (HM LIDOCAINE PATCH) 4 % PTCH Apply 1 patch topically in the morning and at bedtime. 07/09/20   Marcellas Marchant, Veryl Speak, PA-C  naproxen (NAPROSYN) 375 MG tablet Take 1 tablet (375 mg total) by mouth 2 (two) times daily. 07/09/20   Yenny Kosa, Veryl Speak, PA-C  tiZANidine (ZANAFLEX) 4 MG tablet Take 1 tablet (4 mg total) by mouth at bedtime for 10 days. 07/09/20 07/19/20  Ava Tangney, Veryl Speak, PA-C    Family History Family History  Problem Relation Age of Onset  . Healthy Mother   . Healthy Father     Social History Social History   Tobacco Use  . Smoking status: Never Smoker  . Smokeless tobacco: Never Used  Substance Use Topics  . Alcohol use: Not Currently  . Drug use: Not on file     Allergies     Patient has no known allergies.   Review of Systems Review of Systems   Physical Exam Triage Vital Signs ED Triage Vitals  Enc Vitals Group     BP      Pulse      Resp      Temp      Temp src      SpO2      Weight      Height      Head Circumference      Peak Flow      Pain Score      Pain Loc      Pain Edu?      Excl. in GC?    No data found.  Updated Vital Signs BP (!) 137/95 (BP Location: Left Arm)   Pulse 83   Temp 98.4 F (36.9 C) (Oral)   Resp 16   Ht 5\' 2"  (1.575 m)   Wt 180 lb (81.6 kg)  LMP 07/09/2020 (Approximate)   SpO2 100%   Breastfeeding Unknown   BMI 32.92 kg/m   Visual Acuity Right Eye Distance:   Left Eye Distance:   Bilateral Distance:    Right Eye Near:   Left Eye Near:    Bilateral Near:     Physical Exam Vitals and nursing note reviewed.  Constitutional:      General: She is not in acute distress.    Appearance: She is well-developed.  HENT:     Head: Normocephalic and atraumatic.  Eyes:     Conjunctiva/sclera: Conjunctivae normal.  Cardiovascular:     Rate and Rhythm: Normal rate.  Pulmonary:     Effort: Pulmonary effort is normal. No respiratory distress.  Musculoskeletal:     Cervical back: Neck supple. Tenderness (Left paraspinal musculature into the left trapezius.  There is no midline spinal tenderness.  No step-offs or deviations) present. No rigidity.     Comments: Bilateral paralumbar spinal musculature tenderness.  No midline spinal tenderness of the lumbar thoracic or cervical regions.  Patient is ambulatory without issue.  Freely moving entire spinal column without issue.  Straight leg raise negative bilaterally.  Strength is 5/5 in lower extremities.  Fluid gait.  Patient has full range of motion of the upper extremities without issue.  Equal strength 5/5 in the upper extremities.  Sensation intact.  Skin:    General: Skin is warm and dry.  Neurological:     Mental Status: She is alert.      UC  Treatments / Results  Labs (all labs ordered are listed, but only abnormal results are displayed) Labs Reviewed - No data to display  EKG   Radiology No results found.  Procedures Procedures (including critical care time)  Medications Ordered in UC Medications - No data to display  Initial Impression / Assessment and Plan / UC Course  I have reviewed the triage vital signs and the nursing notes.  Pertinent labs & imaging results that were available during my care of the patient were reviewed by me and considered in my medical decision making (see chart for details).     #Neck pain #Chronic low back pain Patient's 34 year old presenting with chronic low back pain and neck pain.  No red flags requiring imaging today.  Treat symptomatically with naproxen and light muscle relaxer.  Recommend lidocaine patches as needed.  Encouraged her to follow-up with her primary care.  Patient verbalized understanding plan of care Final Clinical Impressions(s) / UC Diagnoses   Final diagnoses:  Chronic bilateral low back pain without sciatica  Neck pain     Discharge Instructions     Take naproxen as needed, take this with food  You Zanaflex at night, this will make you sleepy do not drive, drink alcohol or operate machinery within 8 hours of taking Use lidocaine patches twice a day  Follow-up with your primary care in 5 to 7 days      ED Prescriptions    Medication Sig Dispense Auth. Provider   naproxen (NAPROSYN) 375 MG tablet Take 1 tablet (375 mg total) by mouth 2 (two) times daily. 20 tablet Jaclin Finks, Veryl Speak, PA-C   tiZANidine (ZANAFLEX) 4 MG tablet Take 1 tablet (4 mg total) by mouth at bedtime for 10 days. 10 tablet Stefan Markarian, Veryl Speak, PA-C   Lidocaine (HM LIDOCAINE PATCH) 4 % PTCH Apply 1 patch topically in the morning and at bedtime. 10 patch Keilana Morlock, Veryl Speak, PA-C     PDMP not reviewed this  encounter.   Hermelinda Medicus, PA-C 07/09/20 2121

## 2020-07-09 NOTE — ED Triage Notes (Signed)
Patient in today w/ c/o right neck pain and lumbar pain which she states sx onset is approx. 4 months ago.

## 2021-03-02 ENCOUNTER — Other Ambulatory Visit: Payer: Self-pay

## 2021-03-02 ENCOUNTER — Emergency Department
Admission: EM | Admit: 2021-03-02 | Discharge: 2021-03-02 | Disposition: A | Discharge status: Home / Self Care | Payer: No Typology Code available for payment source | Attending: Physician Assistant | Admitting: Physician Assistant

## 2021-03-02 DIAGNOSIS — R350 Frequency of micturition: Secondary | ICD-10-CM | POA: Diagnosis present

## 2021-03-02 DIAGNOSIS — R030 Elevated blood-pressure reading, without diagnosis of hypertension: Secondary | ICD-10-CM | POA: Diagnosis not present

## 2021-03-02 DIAGNOSIS — N390 Urinary tract infection, site not specified: Secondary | ICD-10-CM | POA: Insufficient documentation

## 2021-03-02 LAB — URINALYSIS, COMPLETE (UACMP) WITH MICROSCOPIC
Bilirubin Urine: NEGATIVE
Glucose, UA: NEGATIVE mg/dL
Ketones, ur: NEGATIVE mg/dL
Nitrite: NEGATIVE
Protein, ur: 30 mg/dL — AB
RBC / HPF: >50 RBC/hpf — ABNORMAL HIGH (ref 0–5)
Specific Gravity, Urine: 1.004 — ABNORMAL LOW (ref 1.005–1.030)
WBC, UA: >50 WBC/hpf — ABNORMAL HIGH (ref 0–5)
pH: 6 (ref 5.0–8.0)

## 2021-03-02 LAB — PREGNANCY, URINE: Preg Test, Ur: NEGATIVE

## 2021-03-02 MED ORDER — SULFAMETHOXAZOLE-TRIMETHOPRIM 800-160 MG PO TABS: 1.0000 | ORAL_TABLET | Freq: Two times a day (BID) | ORAL | 0 refills | Status: AC

## 2021-03-02 MED ORDER — PHENAZOPYRIDINE HCL 200 MG PO TABS: 200.0000 mg | ORAL_TABLET | Freq: Three times a day (TID) | ORAL | 0 refills | Status: AC | PRN

## 2021-03-02 NOTE — ED Notes (Signed)
Called and spoke with lab regarding pending pregnancy test - states they are finding specimen and will run it now.

## 2021-03-02 NOTE — ED Provider Notes (Signed)
[facility] Emergency Department Provider Note   ____________________________________________   Event Date/Time   First MD Initiated Contact with Patient 03/02/21 [phone]     (approximate)  I have reviewed the triage vital signs and the nursing notes.   HISTORY  Chief Complaint Urinary Frequency    HPI Silveria Botz is a 35 y.o. female patient presents urinary frequency was started 2 hours prior to arrival.  Patient denies dysuria.  Patient believes this blood in her urine.  Patient last menstrual period was 02/26/2021.  Patient was treated at her clinic 2 weeks ago for UTI.  Given antibiotics but she do not know the name of the antibiotic.  Patient condition resolved until early this morning.  Patient denies vaginal discharge or pelvic pain.         No past medical history on file.  There are no problems to display for this patient.   No past surgical history on file.  Prior to Admission medications   Medication Sig Start Date End Date Taking? Authorizing Provider  phenazopyridine (PYRIDIUM) 200 MG tablet Take 1 tablet (200 mg total) by mouth 3 (three) times daily as needed for pain. 03/02/21  Yes Joni Reining, PA-C  sulfamethoxazole-trimethoprim (BACTRIM DS) 800-160 MG tablet Take 1 tablet by mouth 2 (two) times daily. 03/02/21  Yes Joni Reining, PA-C  Lidocaine (HM LIDOCAINE PATCH) 4 % PTCH Apply 1 patch topically in the morning and at bedtime. 07/09/20   Darr, Gerilyn Pilgrim, PA-C  naproxen (NAPROSYN) 375 MG tablet Take 1 tablet (375 mg total) by mouth 2 (two) times daily. 07/09/20   Darr, Gerilyn Pilgrim, PA-C    Allergies Patient has no known allergies.  Family History  Problem Relation Age of Onset  . Healthy Mother   . Healthy Father     Social History Social History   Tobacco Use  . Smoking status: Never Smoker  . Smokeless tobacco: Never Used  Substance Use Topics  . Alcohol use: Not Currently    Review of Systems Constitutional: No  fever/chills Eyes: No visual changes. ENT: No sore throat. Cardiovascular: Denies chest pain. Respiratory: Denies shortness of breath. Gastrointestinal: No abdominal pain.  No nausea, no vomiting.  No diarrhea.  No constipation. Genitourinary: Negative for dysuria.  Positive for urinary frequency and urgency. Musculoskeletal: Negative for back pain. Skin: Negative for rash. Neurological: Negative for headaches, focal weakness or numbness.   ____________________________________________   PHYSICAL EXAM:  VITAL SIGNS: ED Triage Vitals  Enc Vitals Group     BP 03/02/21 0552 (!) 160/98     Pulse Rate 03/02/21 0552 84     Resp 03/02/21 0552 16     Temp 03/02/21 0552 98.4 F (36.9 C)     Temp Source 03/02/21 0552 Oral     SpO2 03/02/21 0552 100 %     Weight 03/02/21 0551 179 lb (81.2 kg)     Height 03/02/21 0551 5\' 1"  (1.549 m)     Head Circumference --      Peak Flow --      Pain Score --      Pain Loc --      Pain Edu? --      Excl. in GC? --     Constitutional: Alert and oriented. Well appearing and in no acute distress. Cardiovascular: Normal rate, regular rhythm. Grossly normal heart sounds.  Good peripheral circulation.  Elevated blood pressure. Respiratory: Normal respiratory effort.  No retractions. Lungs CTAB. Gastrointestinal: Soft and nontender. No  distention. No abdominal bruits. No CVA tenderness. Genitourinary: Deferred Skin:  Skin is warm, dry and intact. No rash noted. Psychiatric: Mood and affect are normal. Speech and behavior are normal.  ____________________________________________   LABS (all labs ordered are listed, but only abnormal results are displayed)  Labs Reviewed  URINALYSIS, COMPLETE (UACMP) WITH MICROSCOPIC - Abnormal; Notable for the following components:      Result Value   Color, Urine AMBER (*)    APPearance HAZY (*)    Specific Gravity, Urine 1.004 (*)    Hgb urine dipstick LARGE (*)    Protein, ur 30 (*)    Leukocytes,Ua LARGE  (*)    RBC / HPF >50 (*)    WBC, UA >50 (*)    Bacteria, UA RARE (*)    All other components within normal limits  URINE CULTURE  PREGNANCY, URINE  POC URINE PREG, ED   ____________________________________________  EKG   ____________________________________________  RADIOLOGY I, Joni Reining, personally viewed and evaluated these images (plain radiographs) as part of my medical decision making, as well as reviewing the written report by the radiologist.  ED MD interpretation:    Official radiology report(s): No results found.  ____________________________________________   PROCEDURES  Procedure(s) performed (including Critical Care):  Procedures   ____________________________________________   INITIAL IMPRESSION / ASSESSMENT AND PLAN / ED COURSE  As part of my medical decision making, I reviewed the following data within the electronic MEDICAL RECORD NUMBER         Patient presents with urinary frequency and urgency.  Discussed lab results with patient.  Patient complaint physical exam is consistent with urinary tract infection.  Patient given discharge care instruction advised take medication as directed.  Patient advised to have urine retested in 10 days.  Patient also advised to see her PCP and have a 3-day blood pressure check secondary to elevated blood pressure of 160/98 on today's visit.      ____________________________________________   FINAL CLINICAL IMPRESSION(S) / ED DIAGNOSES  Final diagnoses:  Lower urinary tract infectious disease     ED Discharge Orders         Ordered    sulfamethoxazole-trimethoprim (BACTRIM DS) 800-160 MG tablet  2 times daily        03/02/21 0900    phenazopyridine (PYRIDIUM) 200 MG tablet  3 times daily PRN        03/02/21 0900          *Please note:  Ellamay Fors was evaluated in Emergency Department on 03/02/2021 for the symptoms described in the history of present illness. She was evaluated in the context of  the global COVID-19 pandemic, which necessitated consideration that the patient might be at risk for infection with the SARS-CoV-2 virus that causes COVID-19. Institutional protocols and algorithms that pertain to the evaluation of patients at risk for COVID-19 are in a state of rapid change based on information released by regulatory bodies including the CDC and federal and state organizations. These policies and algorithms were followed during the patient's care in the ED.  Some ED evaluations and interventions may be delayed as a result of limited staffing during and the pandemic.*   Note:  This document was prepared using Dragon voice recognition software and may include unintentional dictation errors.    Joni Reining, PA-C 03/02/21 1610    Gilles Chiquito, MD 03/02/21 1146

## 2021-03-02 NOTE — Discharge Instructions (Addendum)
Follow discharge care instruction take medication as directed.  Have your urine rechecked in 10 days.  Positive for 3-day blood pressure check.  Blood pressure today was 160/98.

## 2021-03-02 NOTE — ED Triage Notes (Signed)
Pt with frequent urination for last two hours. Pt states she thinks she has hematuria, but denies dysuria.

## 2021-03-04 LAB — URINE CULTURE: Culture: 20000 — AB

## 2021-03-05 NOTE — Progress Notes (Signed)
ED Antimicrobial Stewardship Positive Culture Follow Up   Kristin Chandler is an 35 y.o. female who presented to [facility] on 03/02/2021 with a chief complaint of  Chief Complaint  Patient presents with  . Urinary Frequency    Recent Results (from the past 720 hour(s))  Urine culture     Status: Abnormal   Collection Time: 03/02/21  5:48 AM   Specimen: Urine, Random  Result Value Ref Range Status   Specimen Description   Final    URINE, RANDOM Performed at [facility], [address] [place], [place] [address]    Special Requests   Final    NONE Performed at [facility], [address] [place], [place] [address]    Culture 20,000 COLONIES/mL PROTEUS MIRABILIS (A)  Final   Report Status 03/04/2021 FINAL  Final   Organism ID, Bacteria PROTEUS MIRABILIS (A)  Final      Susceptibility   Proteus mirabilis - MIC*    AMPICILLIN <=2 SENSITIVE Sensitive     CEFAZOLIN <=4 SENSITIVE Sensitive     CEFEPIME <=0.12 SENSITIVE Sensitive     CEFTRIAXONE <=0.25 SENSITIVE Sensitive     CIPROFLOXACIN <=0.25 SENSITIVE Sensitive     GENTAMICIN <=1 SENSITIVE Sensitive     IMIPENEM 2 SENSITIVE Sensitive     NITROFURANTOIN RESISTANT Resistant     TRIMETH/SULFA >=320 RESISTANT Resistant     AMPICILLIN/SULBACTAM <=2 SENSITIVE Sensitive     PIP/TAZO <=4 SENSITIVE Sensitive     * 20,000 COLONIES/mL PROTEUS MIRABILIS    [x]  Treated with Bactrim, organism resistant to prescribed antimicrobial.  New antibiotic prescription: Cefuroxime 250mg  BID x7days No RFs.  Call Log: 1. Spoke with patient (Ph#: [phone]) and counseled to discontinue bactrim and start cefuroxime. Confirmed preferred pharmacy was [facility] in [place] on [phone]. [place] [address] 2. Called [facility] pharmacy (Ph#: [phone]) and left prescription (see above).   Plan discussed with ED Provider: Dr. [place] (NPI: [id number])   Shaune Pollack 03/05/2021, 11:36 AM Clinical Pharmacist

## 2021-12-16 NOTE — Progress Notes (Signed)
Cardiology Office Note  Date:  12/18/2021   ID:  Kristin Chandler, DOB [birth year], MRN [id number]  PCP:  Toy Cookey, FNP   Chief Complaint  Patient presents with   New Patient (Initial Visit)    Ref by Sandrea Hughs, FNP for tachycardia and HTN. Patient c/o rapid heartbeats and chest pain at times. Medications reviewed by the patient verbally.     HPI:  Kristin Chandler is a 36 yo woman with PMH of  Hypothyroidism Referred by Sandrea Hughs for palpitations, fatigue, HTN, chest pain  Spanish interpreter used on today's visit She reports 1 year of racing heart, Seems to wax and wane among other symptoms  Seen by PMD, one month ago Had lab work done, told that her thyroid was abnormal Started on methimazole 10 mg daily per the patient Notes from primary care requested, reviewed, indicates she was supposed to start atenolol 25 mg daily and HCTZ Reports that she is not taking atenolol, " was told to stop this" Continues to have tachycardia on exertion, heart racing with anything she does  TSH reviewed, very low indicating hyperthyroidism  EKG personally reviewed by myself on todays visit Normal sinus rhythm rate 76 bpm no significant ST-T wave changes   PMH:   has a past medical history of Hypertension.  PSH:   History reviewed. No pertinent surgical history.  Current Outpatient Medications  Medication Sig Dispense Refill   atenolol (TENORMIN) 25 MG tablet Take 1 tablet (25 mg total) by mouth daily. May take one extra tab as needed for tachycardia 100 tablet 3   GNP VITAMIN D MAXIMUM STRENGTH 50 MCG (2000 UT) TABS Take 1 tablet by mouth daily.     hydrochlorothiazide (MICROZIDE) 12.5 MG capsule Take 12.5 mg by mouth daily.     methimazole (TAPAZOLE) 10 MG tablet Take 10 mg by mouth daily.     Lidocaine (HM LIDOCAINE PATCH) 4 % PTCH Apply 1 patch topically in the morning and at bedtime. (Patient not taking: Reported on 12/17/2021) 10 patch 0   naproxen (NAPROSYN) 375 MG tablet Take  1 tablet (375 mg total) by mouth 2 (two) times daily. (Patient not taking: Reported on 12/17/2021) 20 tablet 0   phenazopyridine (PYRIDIUM) 200 MG tablet Take 1 tablet (200 mg total) by mouth 3 (three) times daily as needed for pain. (Patient not taking: Reported on 12/17/2021) 6 tablet 0   sulfamethoxazole-trimethoprim (BACTRIM DS) 800-160 MG tablet Take 1 tablet by mouth 2 (two) times daily. (Patient not taking: Reported on 12/17/2021) 20 tablet 0   No current facility-administered medications for this visit.     Allergies:   Patient has no known allergies.   Social History:  The patient  reports that she has never smoked. She has never used smokeless tobacco. She reports that she does not currently use alcohol. She reports that she does not use drugs.   Family History:   family history includes Healthy in her father and mother.    Review of Systems: Review of Systems  Constitutional: Negative.   Respiratory: Negative.    Cardiovascular:  Positive for palpitations.       Tachycardia  Gastrointestinal: Negative.   Musculoskeletal: Negative.   Neurological: Negative.   Psychiatric/Behavioral: Negative.    All other systems reviewed and are negative.   PHYSICAL EXAM: VS:  BP 128/90 (BP Location: Right Arm, Patient Position: Sitting, Cuff Size: Normal)    Pulse 76    Ht 5\' 1"  (1.549 m)    Wt 171 lb  4 oz (77.7 kg)    SpO2 99%    BMI 32.36 kg/m  , BMI Body mass index is 32.36 kg/m. GEN: Well nourished, well developed, in no acute distress HEENT: normal Neck: no JVD, carotid bruits, or masses Cardiac: RRR; no murmurs, rubs, or gallops,no edema  Respiratory:  clear to auscultation bilaterally, normal work of breathing GI: soft, nontender, nondistended, + BS Kristin: no deformity or atrophy Skin: warm and dry, no rash Neuro:  Strength and sensation are intact Psych: euthymic mood, full affect  Recent Labs: No results found for requested labs within last 8760 hours.    Lipid Panel No  results found for: CHOL, HDL, LDLCALC, TRIG    Wt Readings from Last 3 Encounters:  12/17/21 171 lb 4 oz (77.7 kg)  03/02/21 179 lb (81.2 kg)  07/09/20 180 lb (81.6 kg)     ASSESSMENT AND PLAN:  Problem List Items Addressed This Visit   None Visit Diagnoses     Tachycardia    -  Primary   Relevant Orders   EKG 12-Lead      Hyperthyroidism Noted on recent lab work with primary care, having symptoms of tachycardia palpitations Other clinical features seen on today's visit consistent with hyperthyroidism Currently taking methimazole 10 mg daily, Recommend she discuss further follow-up with primary care, may need methimazole twice daily up to 3 times daily  Tachycardia/palpitations Consistent with hyperthyroidism Did not start atenolol as prescribed by primary care secondary to confusion Through interpreter, we have recommended she start atenolol 25 mg daily Blood pressure today 120 not on atenolol If blood pressure runs low could stop the HCTZ If she has tachycardia on atenolol could increase up to 25 twice daily or 50 daily Symptoms should improve with methimazole over the next several weeks Currently denies chest pain.  Previously reported chest tightness in the setting of tachycardia, atypical in nature and likely secondary to hyperthyroidism    Total encounter time more than 60 minutes  Greater than 50% was spent in counseling and coordination of care with the patient  Patient seen in consultation for Sandrea Hughs and be referred back to her office for ongoing care of the issues detailed above  Signed, Dossie Arbour, M.D., Ph.D. [facility] [place], [place] [phone]

## 2021-12-17 ENCOUNTER — Other Ambulatory Visit: Payer: Self-pay

## 2021-12-17 ENCOUNTER — Encounter: Payer: Self-pay | Admitting: Cardiovascular Disease

## 2021-12-17 ENCOUNTER — Ambulatory Visit (INDEPENDENT_AMBULATORY_CARE_PROVIDER_SITE_OTHER): Payer: 59 | Admitting: Cardiovascular Disease

## 2021-12-17 VITALS — BP 128/90 | HR 76 | Ht 61.0 in | Wt 171.2 lb

## 2021-12-17 DIAGNOSIS — R000 Tachycardia, unspecified: Secondary | ICD-10-CM | POA: Diagnosis not present

## 2021-12-17 MED ORDER — ATENOLOL 25 MG PO TABS: 25.0000 mg | ORAL_TABLET | Freq: Every day | ORAL | 3 refills | Status: AC

## 2021-12-17 NOTE — Patient Instructions (Addendum)
Medication Instructions:  Please take your Atenolol 25 mg daily May take extra atenolol 25 mg as needed for tachycardia   If blood pressure low, you can hold the HCTZ  If you need a refill on your cardiac medications before your next appointment, please call your pharmacy.   Lab work: No new labs needed  Testing/Procedures: No new testing needed  Follow-Up: At [facility], you and your health needs are our priority.  As part of our continuing mission to provide you with exceptional heart care, we have created designated Provider Care Teams.  These Care Teams include your primary Cardiologist (physician) and Advanced Practice Providers (APPs -  Physician Assistants and Nurse Practitioners) who all work together to provide you with the care you need, when you need it.  You will need a follow up appointment as needed  Providers on your designated Care Team:   Murray Hodgkins, NP Christell Faith, PA-C Cadence Kathlen Mody, [place]  COVID-19 Vaccine Information can be found at: [email or web address] For questions related to vaccine distribution or appointments, please email [email or web address] or call [phone].
